# Patient Record
Sex: Female | Born: 1950 | Race: White | Hispanic: No | Marital: Married | Smoking: Former smoker
Health system: Southern US, Community
[De-identification: ages and names within clinical notes are randomized; demographics above are authoritative.]

## PROBLEM LIST (undated history)

## (undated) ENCOUNTER — Emergency Department: Discharge status: Absent without leave | Payer: 59

## (undated) DIAGNOSIS — I10 Essential (primary) hypertension: Secondary | ICD-10-CM

## (undated) HISTORY — PX: CHOLECYSTECTOMY: SHX55

## (undated) HISTORY — PX: ABDOMINAL HYSTERECTOMY: SHX81

## (undated) HISTORY — PX: TONSILLECTOMY: SUR1361

## (undated) HISTORY — PX: CATARACT EXTRACTION: SUR2

---

## 2003-07-18 ENCOUNTER — Encounter: Admission: RE | Admit: 2003-07-18 | Discharge: 2003-07-18 | Payer: Self-pay | Admitting: Family Medicine

## 2009-09-25 ENCOUNTER — Ambulatory Visit: Payer: Self-pay | Admitting: Family Medicine

## 2016-08-22 ENCOUNTER — Emergency Department: Payer: 59

## 2016-08-22 ENCOUNTER — Emergency Department
Admission: EM | Admit: 2016-08-22 | Discharge: 2016-08-22 | Disposition: A | Discharge status: Home / Self Care | Payer: 59 | Attending: Emergency Medicine | Admitting: Emergency Medicine

## 2016-08-22 ENCOUNTER — Encounter: Payer: Self-pay | Admitting: *Deleted

## 2016-08-22 DIAGNOSIS — S199XXA Unspecified injury of neck, initial encounter: Secondary | ICD-10-CM | POA: Diagnosis present

## 2016-08-22 DIAGNOSIS — Y999 Unspecified external cause status: Secondary | ICD-10-CM | POA: Diagnosis not present

## 2016-08-22 DIAGNOSIS — Y92410 Unspecified street and highway as the place of occurrence of the external cause: Secondary | ICD-10-CM | POA: Insufficient documentation

## 2016-08-22 DIAGNOSIS — S161XXA Strain of muscle, fascia and tendon at neck level, initial encounter: Secondary | ICD-10-CM | POA: Diagnosis not present

## 2016-08-22 DIAGNOSIS — R0789 Other chest pain: Secondary | ICD-10-CM | POA: Insufficient documentation

## 2016-08-22 DIAGNOSIS — Y9389 Activity, other specified: Secondary | ICD-10-CM | POA: Diagnosis not present

## 2016-08-22 MED ORDER — IBUPROFEN 600 MG PO TABS
600.0000 mg | ORAL_TABLET | Freq: Once | ORAL | Status: AC
  Administered 2016-08-22: 600 mg via ORAL
  Filled 2016-08-22: qty 1

## 2016-08-22 NOTE — ED Notes (Signed)
Pt alert and oriented X4, active, cooperative, pt in NAD. RR even and unlabored, color WNL.  Pt informed to return if any life threatening symptoms occur.   
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
## 2016-08-22 NOTE — ED Notes (Signed)
Patient transported to X-ray 

## 2016-08-22 NOTE — Discharge Instructions (Signed)
Please take ibuprofen and/or Tylenol around-the-clock. Please drink plenty of fluids and moist heat. Return to the emergency department especially if you have increasing chest pain, abdominal pain, increasing headache or any other new concerns.  Please return immediately if condition worsens. Please contact her primary physician or the physician you were given for referral. If you have any specialist physicians involved in her treatment and plan please also contact them. Thank you for using [facility].
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
## 2017-06-13 ENCOUNTER — Other Ambulatory Visit: Payer: Self-pay

## 2017-06-13 ENCOUNTER — Ambulatory Visit
Admission: EM | Admit: 2017-06-13 | Discharge: 2017-06-13 | Disposition: A | Discharge status: Home / Self Care | Payer: 59 | Attending: Family Medicine | Admitting: Family Medicine

## 2017-06-13 ENCOUNTER — Encounter: Payer: Self-pay | Admitting: Gynecology

## 2017-06-13 DIAGNOSIS — R319 Hematuria, unspecified: Secondary | ICD-10-CM | POA: Diagnosis not present

## 2017-06-13 DIAGNOSIS — N390 Urinary tract infection, site not specified: Secondary | ICD-10-CM | POA: Diagnosis not present

## 2017-06-13 HISTORY — DX: Essential (primary) hypertension: I10

## 2017-06-13 LAB — URINALYSIS, COMPLETE (UACMP) WITH MICROSCOPIC
GLUCOSE, UA: NEGATIVE mg/dL
Nitrite: POSITIVE — AB
PH: 5.5 (ref 5.0–8.0)
Protein, ur: >300 mg/dL — AB
SPECIFIC GRAVITY, URINE: 1.025 (ref 1.005–1.030)

## 2017-06-13 MED ORDER — CEPHALEXIN 500 MG PO CAPS: 500.0000 mg | ORAL_CAPSULE | Freq: Two times a day (BID) | ORAL | 0 refills | Status: AC

## 2017-06-13 MED ORDER — FLUCONAZOLE 150 MG PO TABS: 150.0000 mg | ORAL_TABLET | Freq: Every day | ORAL | 0 refills | Status: DC

## 2017-06-13 NOTE — ED Provider Notes (Signed)
[facility] ____________________________________________  Time seen: Approximately 12:20 PM  I have reviewed the triage vital signs and the nursing notes.   HISTORY  Chief Complaint Hematuria   HPI Cicero Ducklma Lighty is a 66 y.o. female presenting for evaluation of urinary frequency, urinary urgency, some suprapubic pressure is been present for the last 2 days, and reports today noticed a little bit of blood in urine.  Patient denies history of previous urinary tract infection.  States has had some low back pain coming and going, denies flank pain.  Denies vaginal bleeding, vaginal discharge or vaginal concerns.  Denies any other abdominal pain or discomfort.  Denies accompanying fevers, nausea, vomiting, diarrhea.  Reports continues to eat and drink well.  Denies known trigger for current complaints.  Denies other aggravating or alleviating factors.  No over-the-counter medications taken for the same complaint.  Denies chest pain, shortness of breath, abdominal pain, or rash. Denies recent sickness. Denies recent antibiotic use.    Past Medical History:  Diagnosis Date  . Hypertension     There are no active problems to display for this patient.   Past Surgical History:  Procedure Laterality Date  . ABDOMINAL HYSTERECTOMY    . CHOLECYSTECTOMY    . TONSILLECTOMY       No current facility-administered medications for this encounter.   Current Outpatient Medications:  .  hydrochlorothiazide (HYDRODIURIL) 25 MG tablet, Take 25 mg by mouth daily., Disp: , Rfl:  .  cephALEXin (KEFLEX) 500 MG capsule, Take 1 capsule (500 mg total) by mouth 2 (two) times daily for 7 days., Disp: 14 capsule, Rfl: 0 .  fluconazole (DIFLUCAN) 150 MG tablet, Take 1 tablet (150 mg total) by mouth daily. Take one pill orally, then Repeat in one week as needed., Disp: 2 tablet, Rfl: 0  Allergies Patient has no known allergies.  Family History  Problem Relation Age of Onset  . Heart failure  Mother   . Heart failure Father   . Alcohol abuse Father   . COPD Brother     Social History Social History   Tobacco Use  . Smoking status: Never Smoker  . Smokeless tobacco: Never Used  Substance Use Topics  . Alcohol use: No    Frequency: Never  . Drug use: No    Review of Systems Constitutional: No fever/chills Cardiovascular: Denies chest pain. Respiratory: Denies shortness of breath. Gastrointestinal: No abdominal pain.  No nausea, no vomiting.  No diarrhea.   Genitourinary: Positive for dysuria. Musculoskeletal: Negative for back pain. Skin: Negative for rash.  ____________________________________________   PHYSICAL EXAM:  VITAL SIGNS: ED Triage Vitals  Enc Vitals Group     BP 06/13/17 1159 (!) 144/73     Pulse Rate 06/13/17 1159 65     Resp 06/13/17 1159 16     Temp 06/13/17 1159 98.2 F (36.8 C)     Temp Source 06/13/17 1159 Oral     SpO2 06/13/17 1159 100 %     Weight 06/13/17 1200 215 lb (97.5 kg)     Height 06/13/17 1200 5\' 5"  (1.651 m)     Head Circumference --      Peak Flow --      Pain Score 06/13/17 1200 4     Pain Loc --      Pain Edu? --      Excl. in GC? --     Constitutional: Alert and oriented. Well appearing and in no acute distress. Cardiovascular: Normal rate, regular rhythm. Grossly normal  heart sounds.  Good peripheral circulation. Respiratory: Normal respiratory effort without tachypnea nor retractions. Breath sounds are clear and equal bilaterally. No wheezes, rales, rhonchi. Gastrointestinal: Soft and nontender.  No CVA tenderness. Musculoskeletal:  No midline cervical, thoracic or lumbar tenderness to palpation.  Neurologic:  Normal speech and language. Speech is normal. No gait instability.  Skin:  Skin is warm, dry and intact. No rash noted. Psychiatric: Mood and affect are normal. Speech and behavior are normal. Patient exhibits appropriate insight and judgment   ___________________________________________   LABS (all  labs ordered are listed, but only abnormal results are displayed)  Labs Reviewed  URINALYSIS, COMPLETE (UACMP) WITH MICROSCOPIC - Abnormal; Notable for the following components:      Result Value   Color, Urine AMBER (*)    APPearance CLOUDY (*)    Hgb urine dipstick LARGE (*)    Bilirubin Urine SMALL (*)    Ketones, ur TRACE (*)    Protein, ur >300 (*)    Nitrite POSITIVE (*)    Leukocytes, UA SMALL (*)    Squamous Epithelial / LPF 0-5 (*)    Bacteria, UA MANY (*)    All other components within normal limits  URINE CULTURE   ____________________________________________   PROCEDURES Procedures    INITIAL IMPRESSION / ASSESSMENT AND PLAN / ED COURSE  Pertinent labs & imaging results that were available during my care of the patient were reviewed by me and considered in my medical decision making (see chart for details).  Well-appearing patient.  No acute distress.  Urinalysis reviewed and suspect UTI.  Will culture urine.  Also some yeast noted and will treat with oral Diflucan.  Encourage rest, fluids, supportive care.  Discussed strict follow-up and return parameters.Discussed indication, risks and benefits of medications with patient.  Discussed follow up with Primary care physician this week. Discussed follow up and return parameters including no resolution or any worsening concerns. Patient verbalized understanding and agreed to plan.   ____________________________________________   FINAL CLINICAL IMPRESSION(S) / ED DIAGNOSES  Final diagnoses:  Urinary tract infection with hematuria, site unspecified     ED Discharge Orders        Ordered    cephALEXin (KEFLEX) 500 MG capsule  2 times daily     06/13/17 1230    fluconazole (DIFLUCAN) 150 MG tablet  Daily     06/13/17 1230       Note: This dictation was prepared with Dragon dictation along with smaller phrase technology. Any transcriptional errors that result from this process are unintentional.           Renford DillsMiller, Ardian Haberland, NP 06/13/17 1552

## 2017-06-13 NOTE — Discharge Instructions (Signed)
Take medication as prescribed. Rest. Drink plenty of fluids.  ° °Follow up with your primary care physician this week as needed. Return to Urgent care for new or worsening concerns.  ° °

## 2017-06-13 NOTE — ED Triage Notes (Signed)
Patient c/o burning with urination x yesterday and she notice blood in urine this morning.

## 2017-06-16 LAB — URINE CULTURE: Culture: >=100000 — AB

## 2017-06-26 ENCOUNTER — Ambulatory Visit
Admission: EM | Admit: 2017-06-26 | Discharge: 2017-06-26 | Disposition: A | Discharge status: Home / Self Care | Payer: 59 | Attending: Family Medicine | Admitting: Family Medicine

## 2017-06-26 ENCOUNTER — Encounter: Payer: Self-pay | Admitting: *Deleted

## 2017-06-26 DIAGNOSIS — R350 Frequency of micturition: Secondary | ICD-10-CM | POA: Diagnosis not present

## 2017-06-26 DIAGNOSIS — N390 Urinary tract infection, site not specified: Secondary | ICD-10-CM | POA: Diagnosis not present

## 2017-06-26 DIAGNOSIS — R300 Dysuria: Secondary | ICD-10-CM

## 2017-06-26 DIAGNOSIS — R319 Hematuria, unspecified: Secondary | ICD-10-CM | POA: Diagnosis not present

## 2017-06-26 LAB — URINALYSIS, COMPLETE (UACMP) WITH MICROSCOPIC
Bilirubin Urine: NEGATIVE
GLUCOSE, UA: NEGATIVE mg/dL
Ketones, ur: NEGATIVE mg/dL
Nitrite: NEGATIVE
PH: 6 (ref 5.0–8.0)
Protein, ur: 30 mg/dL — AB
SQUAMOUS EPITHELIAL / LPF: NONE SEEN
Specific Gravity, Urine: 1.01 (ref 1.005–1.030)

## 2017-06-26 MED ORDER — FLUCONAZOLE 150 MG PO TABS: 150.0000 mg | ORAL_TABLET | Freq: Every day | ORAL | 0 refills | Status: DC

## 2017-06-26 MED ORDER — NITROFURANTOIN MONOHYD MACRO 100 MG PO CAPS: 100.0000 mg | ORAL_CAPSULE | Freq: Two times a day (BID) | ORAL | 0 refills | Status: AC

## 2017-06-26 NOTE — ED Triage Notes (Signed)
Dysuria, urinary freq/urg, suprapubic abd pain, onset today. Seen here 2 weeks ago for similar symptoms.

## 2017-06-26 NOTE — ED Provider Notes (Addendum)
[facility] ____________________________________________  Time seen: Approximately 7:30 PM  I have reviewed the triage vital signs and the nursing notes.   HISTORY  Chief Complaint Dysuria; Abdominal Pain; and Urinary Frequency   HPI Cheryl Bowers is a 66 y.o. female presenting for evaluation of 1 day history of urinary frequency and pain with urination with accompanying suprapubic pressure and discomfort.  Patient reports 2 weeks ago she was seen in urgent care and was treated for the same complaints with a urinary tract infection.  Patient states that she took full antibiotic as prescribed and was much better.  States symptoms had fully resolved and she had approximately 5 days of no symptoms.  Patient denies known trigger.  Denies recent diarrhea, vaginal complaints, vomiting, back pain.  Reports is continue to eat and drink well, continues to drink fluids well.  Denies sexual activity triggers.  Denies accompanying fevers.  Reports otherwise feels well.  States mild suprapubic discomfort currently. Denies chest pain, shortness of breath, or rash. Denies recent sickness.  States no previous recurrent urinary tract infections.  Denies renal insufficiency.  Denies other recent antibiotic use.   Past Medical History:  Diagnosis Date  . Hypertension     There are no active problems to display for this patient.   Past Surgical History:  Procedure Laterality Date  . ABDOMINAL HYSTERECTOMY    . CHOLECYSTECTOMY    . TONSILLECTOMY       No current facility-administered medications for this encounter.   Current Outpatient Medications:  .  hydrochlorothiazide (HYDRODIURIL) 25 MG tablet, Take 25 mg by mouth daily., Disp: , Rfl:  .  fluconazole (DIFLUCAN) 150 MG tablet, Take 1 tablet (150 mg total) by mouth daily. Take one pill orally as needed., Disp: 1 tablet, Rfl: 0 .  nitrofurantoin, macrocrystal-monohydrate, (MACROBID) 100 MG capsule, Take 1 capsule (100 mg total) by  mouth 2 (two) times daily for 7 days., Disp: 14 capsule, Rfl: 0  Allergies Patient has no known allergies.  Family History  Problem Relation Age of Onset  . Heart failure Mother   . Heart failure Father   . Alcohol abuse Father   . COPD Brother     Social History Social History   Tobacco Use  . Smoking status: Former Games developermoker  . Smokeless tobacco: Never Used  Substance Use Topics  . Alcohol use: No    Frequency: Never  . Drug use: No    Review of Systems Constitutional: No fever/chills Cardiovascular: Denies chest pain. Respiratory: Denies shortness of breath. Gastrointestinal: No abdominal pain.  No nausea, no vomiting.  No diarrhea.  Genitourinary: Positive for dysuria. Musculoskeletal: Negative for back pain. Skin: Negative for rash.   ____________________________________________   PHYSICAL EXAM:  VITAL SIGNS: ED Triage Vitals  Enc Vitals Group     BP 06/26/17 1843 128/70     Pulse Rate 06/26/17 1843 71     Resp 06/26/17 1843 16     Temp 06/26/17 1843 98.9 F (37.2 C)     Temp Source 06/26/17 1843 Oral     SpO2 06/26/17 1843 97 %     Weight 06/26/17 1845 210 lb (95.3 kg)     Height 06/26/17 1845 5\' 5"  (1.651 m)     Head Circumference --      Peak Flow --      Pain Score 06/26/17 1845 6     Pain Loc --      Pain Edu? --      Excl. in GC? --  Constitutional: Alert and oriented. Well appearing and in no acute distress. Cardiovascular: Normal rate, regular rhythm. Grossly normal heart sounds.  Good peripheral circulation. Respiratory: Normal respiratory effort without tachypnea nor retractions. Breath sounds are clear and equal bilaterally. No wheezes, rales, rhonchi. Gastrointestinal: Minimal midline suprapubic tenderness.  Abdomen otherwise soft and nontender.  No CVA tenderness. Musculoskeletal:No midline cervical, thoracic or lumbar tenderness to palpation. Neurologic:  Normal speech and language. Speech is normal. No gait instability.  Skin:   Skin is warm, dry and intact. No rash noted. Psychiatric: Mood and affect are normal. Speech and behavior are normal. Patient exhibits appropriate insight and judgment   ___________________________________________   LABS (all labs ordered are listed, but only abnormal results are displayed)  Labs Reviewed  URINALYSIS, COMPLETE (UACMP) WITH MICROSCOPIC - Abnormal; Notable for the following components:      Result Value   Color, Urine STRAW (*)    APPearance HAZY (*)    Hgb urine dipstick MODERATE (*)    Protein, ur 30 (*)    Leukocytes, UA LARGE (*)    Non Squamous Epithelial PRESENT (*)    Bacteria, UA FEW (*)    All other components within normal limits  URINE CULTURE    RADIOLOGY  No results found. ____________________________________________   PROCEDURES Procedures    INITIAL IMPRESSION / ASSESSMENT AND PLAN / ED COURSE  Pertinent labs & imaging results that were available during my care of the patient were reviewed by me and considered in my medical decision making (see chart for details).  Well-appearing patient.  No acute distress.  Patient seen in urgent care approximately 2 weeks ago for similar complaint, urine culture was positive for E. coli without resistances antibiotics.  Urinalysis reviewed.  Will treat UTI with oral Macrobid.  Will again reculture urine.  Encourage patient to have urine recheck within 2-3 days of completing antibiotic therapy.  Encourage rest, fluids, supportive care.  Discussed strict follow-up and return parameters.  Discussed follow up with Primary care physician this week. Discussed follow up and return parameters including no resolution or any worsening concerns. Patient verbalized understanding and agreed to plan.   ____________________________________________   FINAL CLINICAL IMPRESSION(S) / ED DIAGNOSES  Final diagnoses:  Urinary tract infection with hematuria, site unspecified     ED Discharge Orders        Ordered     nitrofurantoin, macrocrystal-monohydrate, (MACROBID) 100 MG capsule  2 times daily     06/26/17 2001    fluconazole (DIFLUCAN) 150 MG tablet  Daily     06/26/17 2001       Note: This dictation was prepared with Dragon dictation along with smaller phrase technology. Any transcriptional errors that result from this process are unintentional.         Renford DillsMiller, Allison Deshotels, NP 06/26/17 2054

## 2017-06-26 NOTE — Discharge Instructions (Signed)
Take medication as prescribed. Rest. Drink plenty of fluids.  ° °Follow up with your primary care physician as discussed. Return to Urgent care for new or worsening concerns.  ° °

## 2017-06-29 LAB — URINE CULTURE

## 2017-06-30 ENCOUNTER — Telehealth: Payer: Self-pay | Admitting: Emergency Medicine

## 2017-07-03 NOTE — Telephone Encounter (Signed)
close

## 2018-02-05 ENCOUNTER — Other Ambulatory Visit: Payer: Self-pay | Admitting: Family Medicine

## 2018-02-05 DIAGNOSIS — Z1231 Encounter for screening mammogram for malignant neoplasm of breast: Secondary | ICD-10-CM

## 2018-02-15 ENCOUNTER — Encounter (INDEPENDENT_AMBULATORY_CARE_PROVIDER_SITE_OTHER): Payer: Self-pay

## 2018-02-15 ENCOUNTER — Ambulatory Visit
Admission: RE | Admit: 2018-02-15 | Discharge: 2018-02-15 | Disposition: A | Discharge status: Home / Self Care | Payer: 59 | Source: Ambulatory Visit | Attending: Family Medicine | Admitting: Family Medicine

## 2018-02-15 DIAGNOSIS — Z1231 Encounter for screening mammogram for malignant neoplasm of breast: Secondary | ICD-10-CM | POA: Diagnosis not present
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
## 2022-10-28 ENCOUNTER — Ambulatory Visit
Admission: EM | Admit: 2022-10-28 | Discharge: 2022-10-28 | Disposition: A | Discharge status: Home / Self Care | Payer: Medicare Other | Attending: Family | Admitting: Family

## 2022-10-28 ENCOUNTER — Other Ambulatory Visit: Payer: Self-pay

## 2022-10-28 ENCOUNTER — Ambulatory Visit (INDEPENDENT_AMBULATORY_CARE_PROVIDER_SITE_OTHER): Payer: Medicare Other

## 2022-10-28 DIAGNOSIS — I10 Essential (primary) hypertension: Secondary | ICD-10-CM | POA: Diagnosis not present

## 2022-10-28 DIAGNOSIS — M25561 Pain in right knee: Secondary | ICD-10-CM

## 2022-10-28 MED ORDER — KETOROLAC TROMETHAMINE 30 MG/ML IJ SOLN
60.0000 mg | Freq: Once | INTRAMUSCULAR | Status: AC
  Administered 2022-10-28: 60 mg via INTRAMUSCULAR

## 2022-10-28 NOTE — ED Provider Notes (Signed)
[facility]    CSN: [id number] Arrival date & time: 10/28/22  0803      History   Chief Complaint Chief Complaint  Patient presents with   Knee Pain    HPI Cheryl Bowers is a 72 y.o. female.   72 year old female accompanied by her significant other presents with right knee pain that started yesterday. She was walking in her yard over uneven surfaces and thinks she may have stepped wrong and experienced immediate sharp pain at the inside area of her right knee. The pain traveled up to her thigh and down her shin. She tried applying Voltaren gel to area and taking Ibuprofen 800mg  with minimal relief. She has been unable to sleep due to pain and today her right knee is swollen and very tender. She even applied a Velcro knee brace which helped some with support but caused her toes to become slightly numb. She did "twist" her right knee a few weeks ago but it had improved before yesterday. No other previous injury to her right knee. She has seen [facility] in [place] in the past for an issue with her left hand/wrist and prefers to see [facility] again. Other chronic health issues include HTN. Usually takes HCTZ 25mg  and Lisinopril 10mg  daily but has not taken her medication yet today and pain level 8-10/10. (10/10 when trying to bend knee).   The history is provided by the patient.    Past Medical History:  Diagnosis Date   Hypertension     There are no problems to display for this patient.   Past Surgical History:  Procedure Laterality Date   ABDOMINAL HYSTERECTOMY     CATARACT EXTRACTION Bilateral    CHOLECYSTECTOMY     TONSILLECTOMY      OB History   No obstetric history on file.      Home Medications    Prior to Admission medications   Medication Sig Start Date End Date Taking? Authorizing Provider  hydrochlorothiazide (HYDRODIURIL) 25 MG tablet Take 25 mg by mouth daily.   Yes [provider]  lisinopril (ZESTRIL) 10 MG tablet Take 10  mg by mouth daily.   Yes [provider]    Family History Family History  Problem Relation Age of Onset   Heart failure Mother    Heart failure Father    Alcohol abuse Father    COPD Brother    Breast cancer Neg Hx     Social History Social History   Tobacco Use   Smoking status: Former   Smokeless tobacco: Never  Substance Use Topics   Alcohol use: No   Drug use: No     Allergies   Patient has no known allergies.   Review of Systems Review of Systems  Constitutional:  Positive for activity change. Negative for chills and fever.  Respiratory:  Negative for chest tightness and shortness of breath.   Cardiovascular:  Negative for chest pain and palpitations.  Musculoskeletal:  Positive for arthralgias, gait problem, joint swelling and myalgias.  Skin:  Negative for color change and rash.  Allergic/Immunologic: Negative for environmental allergies, food allergies and immunocompromised state.  Neurological:  Positive for numbness. Negative for tremors, seizures, syncope, speech difficulty and weakness.  Hematological:  Negative for adenopathy. Does not bruise/bleed easily.  Psychiatric/Behavioral:  Positive for sleep disturbance.      Physical Exam Triage Vital Signs ED Triage Vitals  Enc Vitals Group     BP 10/28/22 0836 (!) 220/120  Pulse Rate 10/28/22 0836 (!) 59     Resp 10/28/22 0836 20     Temp 10/28/22 0836 98.2 F (36.8 C)     Temp src --      SpO2 10/28/22 0836 100 %     Weight --      Height --      Head Circumference --      Peak Flow --      Pain Score 10/28/22 0834 8     Pain Loc --      Pain Edu? --      Excl. in GC? --    No data found.  Updated Vital Signs BP (!) 180/100   Pulse 64   Temp 98.2 F (36.8 C)   Resp 20   SpO2 100%   Visual Acuity Right Eye Distance:   Left Eye Distance:   Bilateral Distance:    Right Eye Near:   Left Eye Near:    Bilateral Near:     Physical Exam Vitals and nursing note reviewed.   Constitutional:      General: She is awake. She is not in acute distress.    Appearance: She is well-developed.     Comments: She is sitting in the exam chair in no acute distress but appears uncomfortable due to right knee pain.   HENT:     Head: Normocephalic and atraumatic.     Right Ear: Hearing normal.     Left Ear: Hearing normal.  Cardiovascular:     Rate and Rhythm: Normal rate.  Pulmonary:     Effort: Pulmonary effort is normal.  Musculoskeletal:        General: Swelling and tenderness present.     Right knee: Swelling present. No erythema or ecchymosis. Decreased range of motion. Tenderness present over the medial joint line. No lateral joint line tenderness. Normal alignment and normal patellar mobility. Normal pulse.     Left knee: Normal.       Legs:     Comments: Right knee- Decreased range of motion, especially with flexion- unable to bend more than 60 degrees. Pain with any movement. Swelling and very tender along medial joint line. No erythema. No distinct fluid present. No distinct numbness present. Normal distal pulses. Good capillary refill.   Skin:    General: Skin is warm and dry.     Capillary Refill: Capillary refill takes less than 2 seconds.     Findings: No abrasion, bruising, ecchymosis, erythema, laceration, rash or wound.  Neurological:     General: No focal deficit present.     Mental Status: She is alert and oriented to person, place, and time.     Sensory: Sensation is intact. No sensory deficit.  Psychiatric:        Mood and Affect: Mood normal.        Behavior: Behavior normal. Behavior is cooperative.      UC Treatments / Results  Labs (all labs ordered are listed, but only abnormal results are displayed) Labs Reviewed - No data to display  EKG   Radiology DG Knee Complete 4 Views Right  Result Date: 10/28/2022 CLINICAL DATA:  Twisted knee followed by sharp pain and swelling. EXAM: RIGHT KNEE - COMPLETE 4+ VIEW COMPARISON:  None  Available. FINDINGS: There is no acute fracture or dislocation. Knee alignment is normal. There is mild tricompartmental joint space narrowing with minimal associated osteophytosis. The soft tissues are unremarkable. There is no effusion. IMPRESSION: No acute finding in the  knee.  Mild degenerative changes. Electronically Signed   By: Lesia Hausen M.D.   On: 10/28/2022 09:01    Procedures Procedures (including critical care time)  Medications Ordered in UC Medications  ketorolac (TORADOL) 30 MG/ML injection 60 mg (60 mg Intramuscular Given 10/28/22 1015)    Initial Impression / Assessment and Plan / UC Course  I have reviewed the triage vital signs and the nursing notes.  Pertinent labs & imaging results that were available during my care of the patient were reviewed by me and considered in my medical decision making (see chart for details).     Reviewed X-ray results with patient. No fracture or dislocation. No distinct joint effusion. Does show some mild degenerative changes. Reviewed that X-ray can not detect tendon or ligament injury- may need MRI. Reviewed elevated blood pressure with patient- repeated BP measurement and has decreased slightly. Encouraged to take Lisinopril and HCTZ as directed. Unable to prescribe Prednisone or steroids due to elevated BP. Patient in significant pain but do not feel narcotic pain medication is indicated at this time. Gave Toradol 60mg  IM now to help with pain and inflammation. Reviewed that this medication can also raise blood pressure so again, important to take BP medication. Patient denies any chest pain, difficulty breathing, vision changes and BP has been >170/>95 in the past. Continue to monitor and follow-up with her PCP if BP remains >150/>90. Continue to wear Velcro knee brace for support. Keep right leg elevated as much as possible. May also take OTC Tylenol 1000mg  every 8 hours as needed for pain. Note written for work. Recommend call [facility] in [place] today to schedule appointment for follow-up. If unable to see [facility] in the next few days and the pain does not improve today after Toradol injection, recommend go to [facility] walk[facility]in [facility] for further evaluation. Otherwise, follow-up with [facility] as planned.   Final Clinical Impressions(s) / UC Diagnoses   Final diagnoses:  Right medial knee pain  Elevated blood pressure reading with diagnosis of hypertension     Discharge Instructions      You were given a shot of Toradol 60mg  today to help with pain and swelling. This can raise your blood pressure slightly and your blood pressure is very elevated today, most  likely due to severe pain that you are experiencing. Continue to monitor, continue your current medication and follow-up with your PCP if BP continues to be >150/>90.  Continue to wear knee brace that you have for support. May take OTC Tylenol 1000mg  every 8 hours as needed for pain. Follow-up with [facility] who you have seen before for further evaluation. If you are unable to see them within the next few days or if the pain does not improve today, go to [facility] in [place]. Follow-up with Orthopedic as planned.     ED Prescriptions   None    PDMP not reviewed this encounter.   Sudie Grumbling, NP 10/28/22 [phone]

## 2022-10-28 NOTE — Discharge Instructions (Addendum)
You were given a shot of Toradol 60mg  today to help with pain and swelling. This can raise your blood pressure slightly and your blood pressure is very elevated today, most  likely due to severe pain that you are experiencing. Continue to monitor, continue your current medication and follow-up with your PCP if BP continues to be >150/>90.  Continue to wear knee brace that you have for support. May take OTC Tylenol 1000mg  every 8 hours as needed for pain. Follow-up with [facility] who you have seen before for further evaluation. If you are unable to see them within the next few days or if the pain does not improve today, go to [facility] in [place]. Follow-up with Orthopedic as planned.

## 2022-10-28 NOTE — ED Triage Notes (Signed)
Pt states she was walking in the yard and twisted knee some sort of way and started experiencing sharp pains and swelling of right knee.
